# Patient Record
Sex: Male | Born: 1950 | Race: White | Hispanic: No | Marital: Married | Smoking: Never smoker
Health system: Southern US, Community
[De-identification: ages and names within clinical notes are randomized; demographics above are authoritative.]

## PROBLEM LIST (undated history)

## (undated) DIAGNOSIS — E119 Type 2 diabetes mellitus without complications: Secondary | ICD-10-CM

## (undated) DIAGNOSIS — C801 Malignant (primary) neoplasm, unspecified: Secondary | ICD-10-CM

---

## 2002-04-20 ENCOUNTER — Encounter: Payer: Self-pay | Admitting: Emergency Medicine

## 2002-04-20 ENCOUNTER — Emergency Department (HOSPITAL_COMMUNITY): Admission: EM | Admit: 2002-04-20 | Discharge: 2002-04-20 | Payer: Self-pay | Admitting: Emergency Medicine

## 2002-09-08 ENCOUNTER — Ambulatory Visit (HOSPITAL_COMMUNITY): Admission: RE | Admit: 2002-09-08 | Discharge: 2002-09-08 | Payer: Self-pay | Admitting: *Deleted

## 2006-11-03 ENCOUNTER — Observation Stay (HOSPITAL_COMMUNITY): Admission: EM | Admit: 2006-11-03 | Discharge: 2006-11-04 | Payer: Self-pay | Admitting: Emergency Medicine

## 2010-09-04 ENCOUNTER — Emergency Department (HOSPITAL_COMMUNITY)
Admission: EM | Admit: 2010-09-04 | Discharge: 2010-09-04 | Disposition: A | Discharge status: Home / Self Care | Payer: 59 | Attending: Emergency Medicine | Admitting: Emergency Medicine

## 2010-09-04 ENCOUNTER — Emergency Department (HOSPITAL_COMMUNITY): Payer: 59

## 2010-09-04 DIAGNOSIS — IMO0002 Reserved for concepts with insufficient information to code with codable children: Secondary | ICD-10-CM | POA: Insufficient documentation

## 2012-07-09 IMAGING — CR DG LUMBAR SPINE COMPLETE 4+V
5 series · 5 of 5 positions shown · non-contrast
Comparison: None.

CLINICAL DATA: Low back pain radiating to the right hip

LUMBAR SPINE - COMPLETE 4+ VIEW

[t l-spine a.p.]
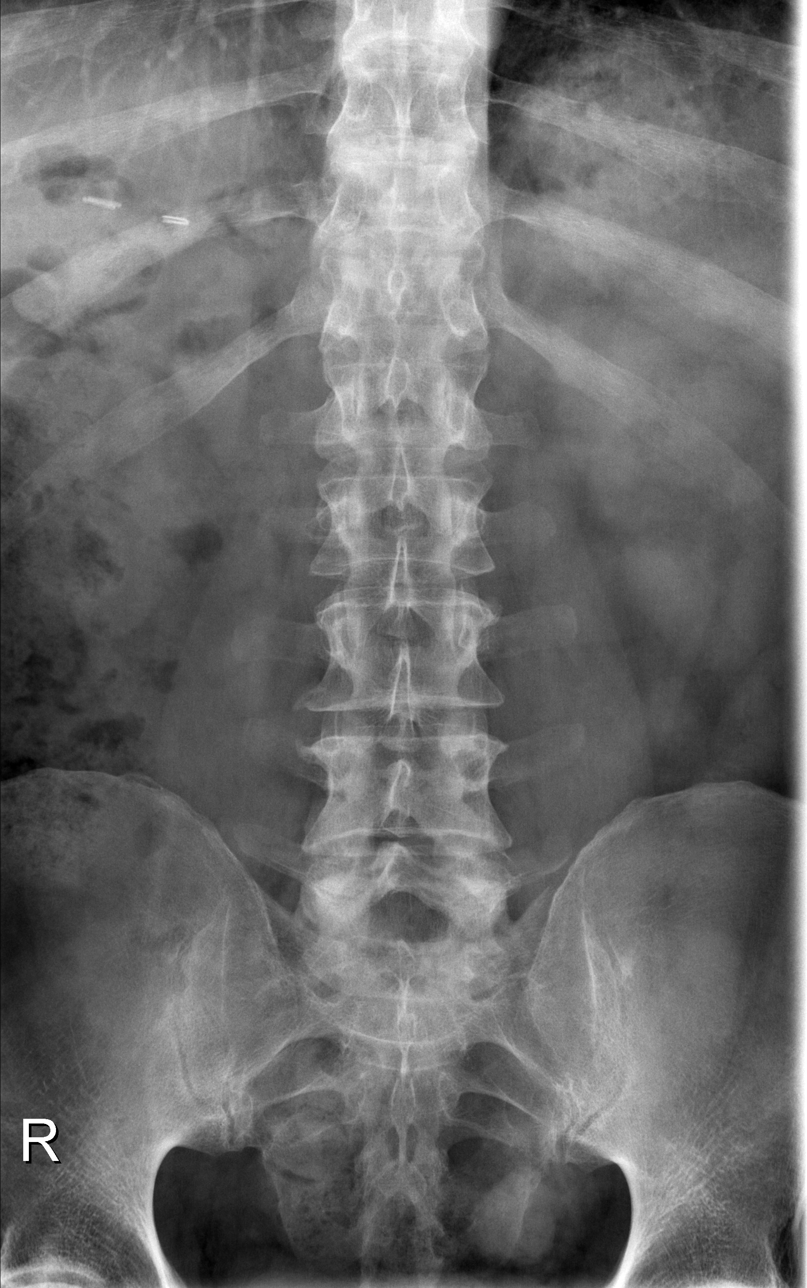

[t l-spine oblique exposure (1 of 2)]
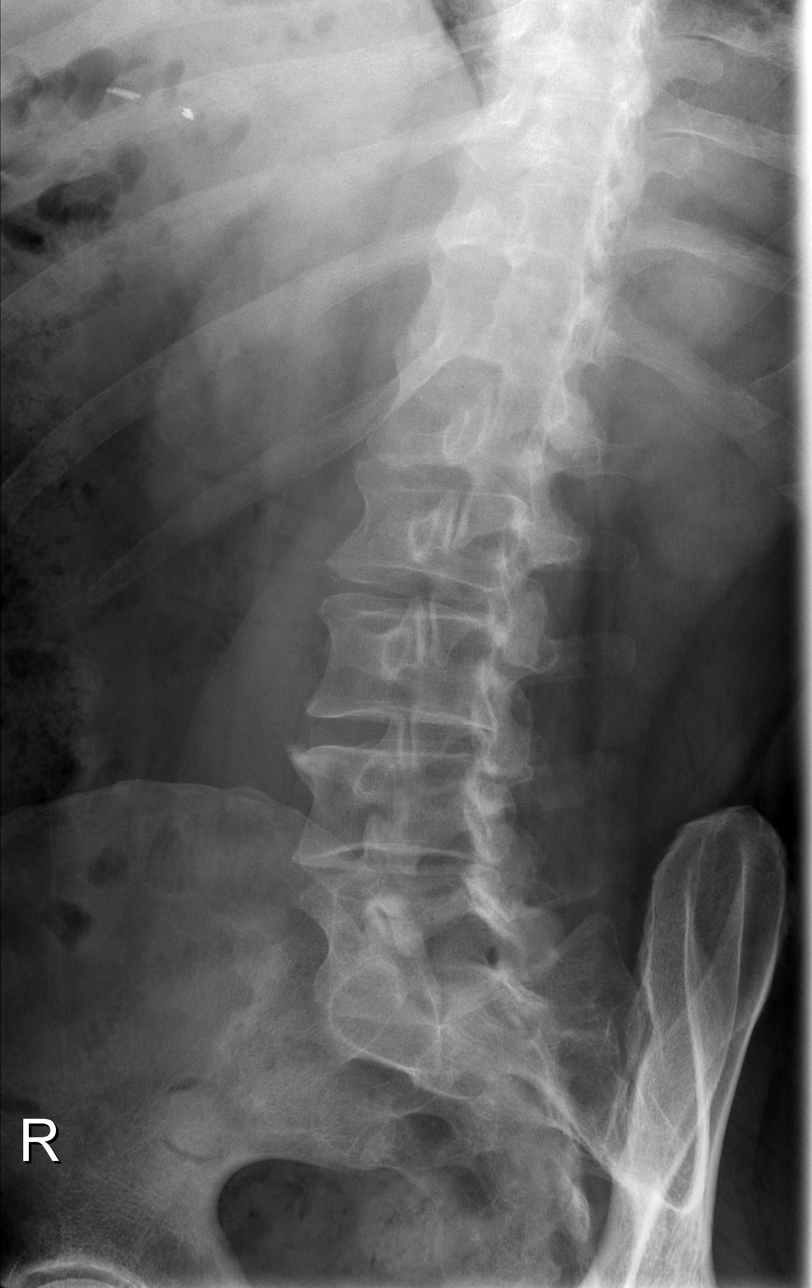

[t l-spine oblique exposure (2 of 2)]
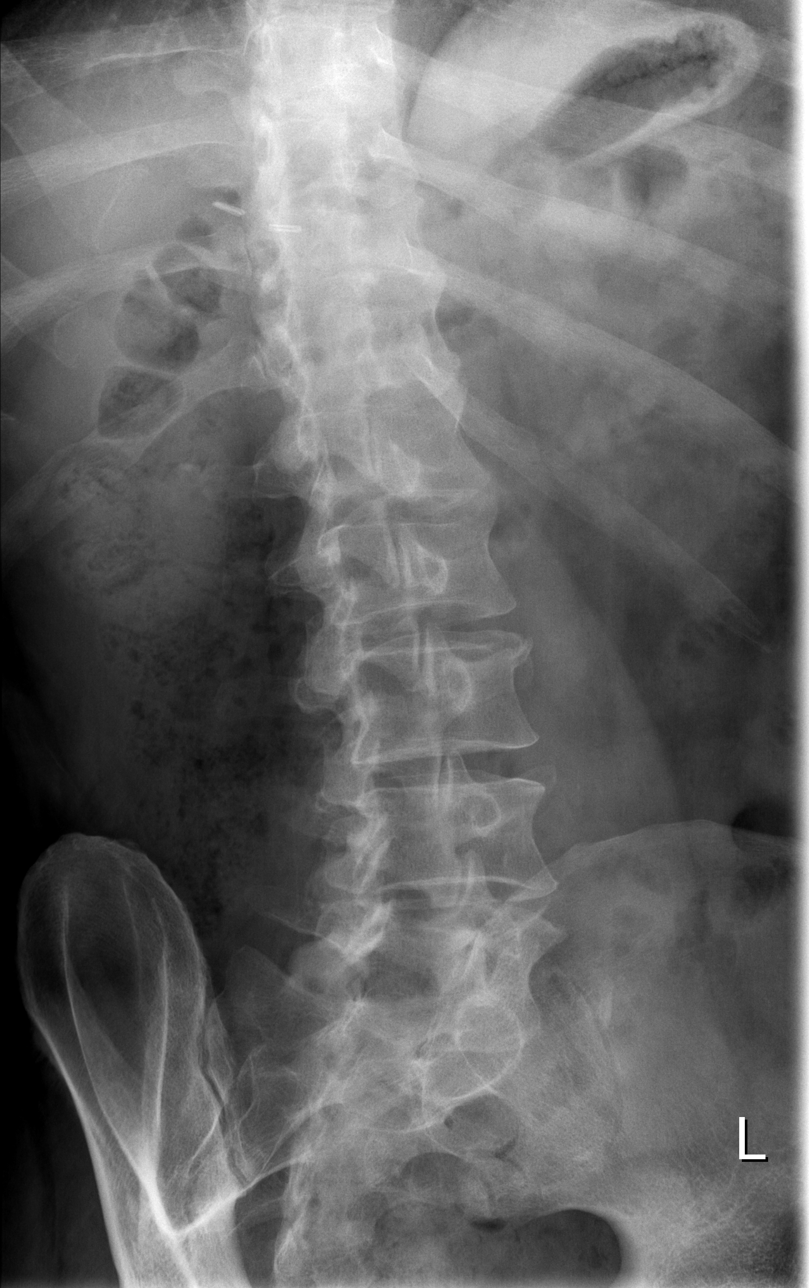

[t l-spine lat]
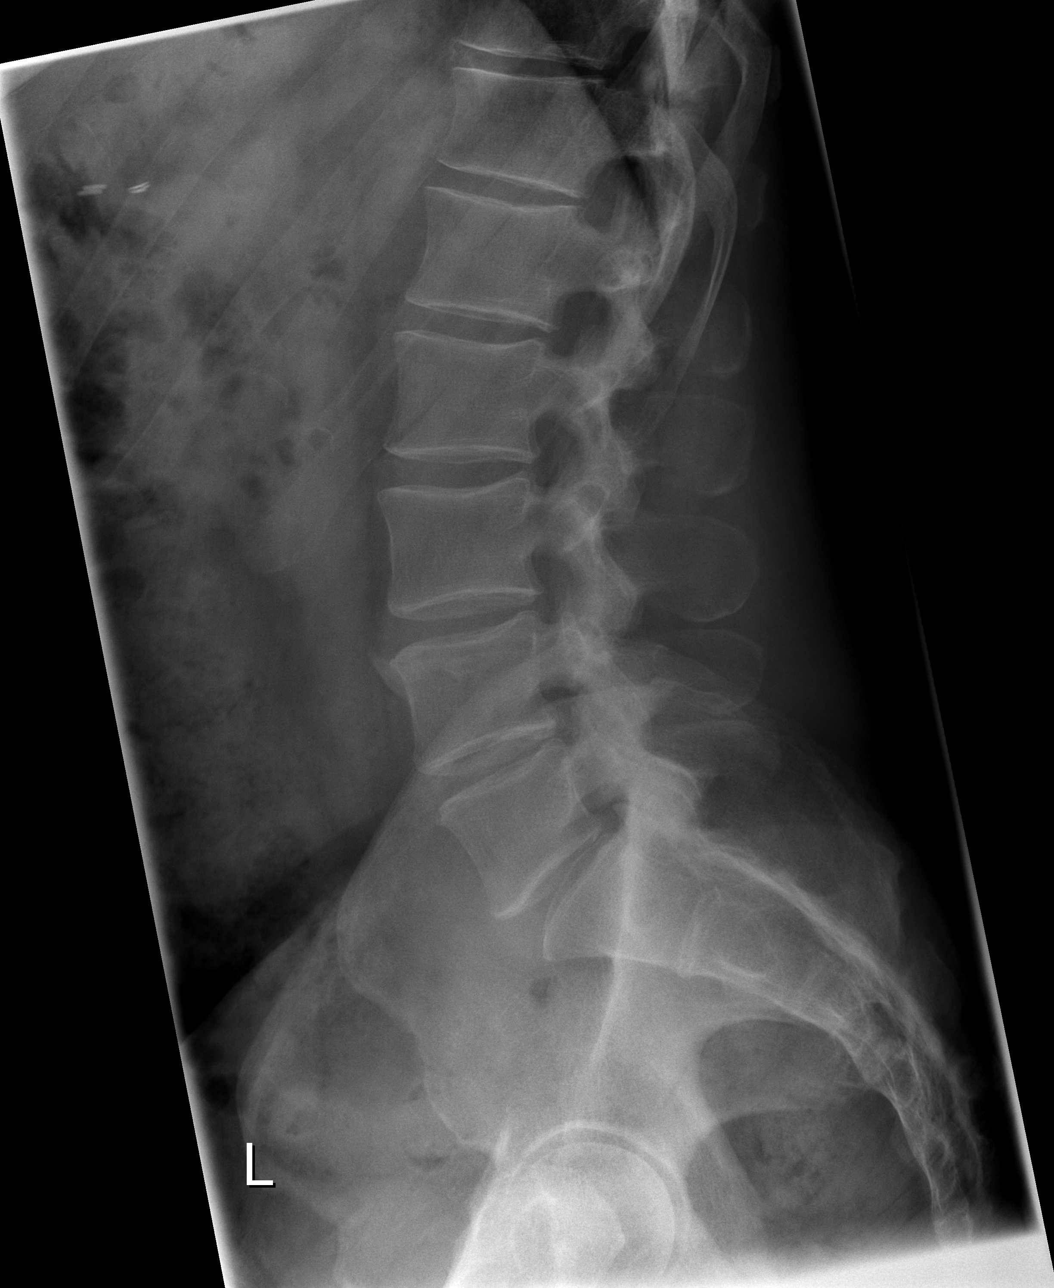

[t l-spine l5-s1 spot *]
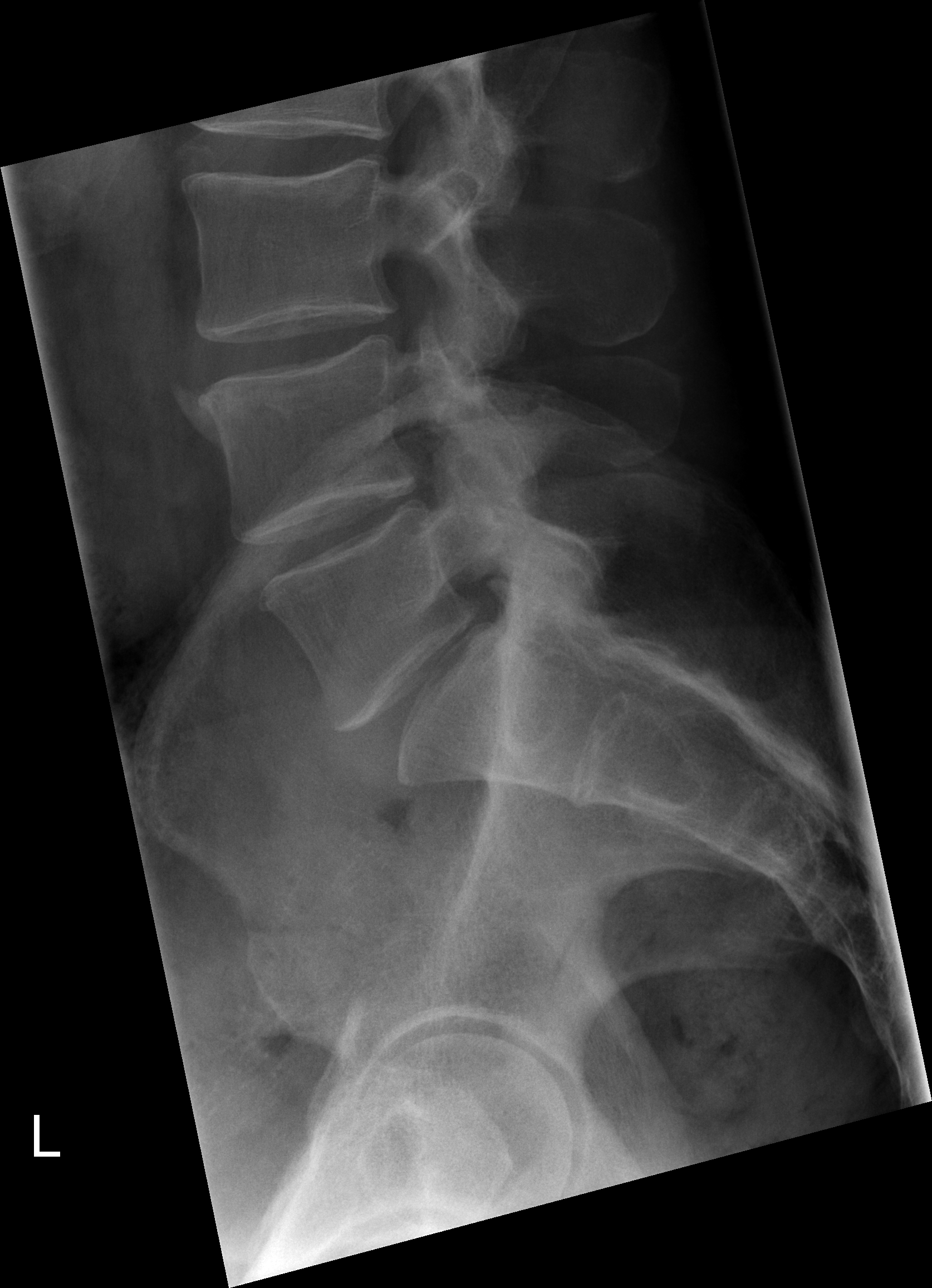

[5 of 5 positions shown; findings below may reference images not displayed]

FINDINGS: Cholecystectomy clips noted. Five non-rib bearing lumbar
type vertebral bodies are identified.  No compression deformity.
Mild disc space narrowing at L5-S1.
IMPRESSION: No acute osseous abnormality.

## 2012-07-17 DIAGNOSIS — C801 Malignant (primary) neoplasm, unspecified: Secondary | ICD-10-CM

## 2012-07-17 HISTORY — PX: PROSTATECTOMY: SHX69

## 2012-07-17 HISTORY — DX: Malignant (primary) neoplasm, unspecified: C80.1

## 2014-06-16 ENCOUNTER — Encounter (HOSPITAL_COMMUNITY): Payer: Self-pay | Admitting: *Deleted

## 2014-06-24 ENCOUNTER — Other Ambulatory Visit: Payer: Self-pay | Admitting: Gastroenterology

## 2014-06-24 NOTE — Addendum Note (Signed)
Addended by: Wilford Corner on: 06/24/2014 04:15 PM   Modules accepted: Orders

## 2014-06-24 NOTE — Anesthesia Preprocedure Evaluation (Addendum)
Anesthesia Evaluation  Patient identified by MRN, date of birth, ID band Patient awake    Reviewed: Allergy & Precautions, H&P , NPO status , Patient's Chart, lab work & pertinent test results  History of Anesthesia Complications Negative for: history of anesthetic complications  Airway Mallampati: II  TM Distance: >3 FB Neck ROM: Full    Dental no notable dental hx. (+) Dental Advisory Given   Pulmonary neg pulmonary ROS,  breath sounds clear to auscultation  Pulmonary exam normal       Cardiovascular Exercise Tolerance: Good negative cardio ROS  Rhythm:Regular Rate:Normal     Neuro/Psych negative neurological ROS  negative psych ROS   GI/Hepatic negative GI ROS, Neg liver ROS,   Endo/Other  diabetes, Type 2, Oral Hypoglycemic Agents  Renal/GU negative Renal ROS  negative genitourinary   Musculoskeletal negative musculoskeletal ROS (+)   Abdominal   Peds negative pediatric ROS (+)  Hematology negative hematology ROS (+)   Anesthesia Other Findings   Reproductive/Obstetrics negative OB ROS                            Anesthesia Physical Anesthesia Plan  ASA: II  Anesthesia Plan: MAC   Post-op Pain Management:    Induction: Intravenous  Airway Management Planned: Nasal Cannula  Additional Equipment:   Intra-op Plan:   Post-operative Plan: Extubation in OR  Informed Consent: I have reviewed the patients History and Physical, chart, labs and discussed the procedure including the risks, benefits and alternatives for the proposed anesthesia with the patient or authorized representative who has indicated his/her understanding and acceptance.   Dental advisory given  Plan Discussed with: CRNA  Anesthesia Plan Comments:         Anesthesia Quick Evaluation

## 2014-06-25 ENCOUNTER — Ambulatory Visit (HOSPITAL_COMMUNITY): Payer: BC Managed Care – PPO | Admitting: Anesthesiology

## 2014-06-25 ENCOUNTER — Encounter (HOSPITAL_COMMUNITY)
Admission: RE | Disposition: A | Discharge status: Home / Self Care | Payer: 59 | Source: Ambulatory Visit | Attending: Gastroenterology

## 2014-06-25 ENCOUNTER — Ambulatory Visit (HOSPITAL_COMMUNITY)
Admission: RE | Admit: 2014-06-25 | Discharge: 2014-06-25 | Disposition: A | Discharge status: Home / Self Care | Payer: BC Managed Care – PPO | Source: Ambulatory Visit | Attending: Gastroenterology | Admitting: Gastroenterology

## 2014-06-25 ENCOUNTER — Encounter (HOSPITAL_COMMUNITY): Payer: Self-pay | Admitting: Registered Nurse

## 2014-06-25 DIAGNOSIS — K648 Other hemorrhoids: Secondary | ICD-10-CM | POA: Insufficient documentation

## 2014-06-25 DIAGNOSIS — D124 Benign neoplasm of descending colon: Secondary | ICD-10-CM | POA: Insufficient documentation

## 2014-06-25 DIAGNOSIS — E119 Type 2 diabetes mellitus without complications: Secondary | ICD-10-CM | POA: Diagnosis not present

## 2014-06-25 DIAGNOSIS — K635 Polyp of colon: Secondary | ICD-10-CM | POA: Diagnosis present

## 2014-06-25 DIAGNOSIS — D123 Benign neoplasm of transverse colon: Secondary | ICD-10-CM | POA: Diagnosis not present

## 2014-06-25 HISTORY — DX: Type 2 diabetes mellitus without complications: E11.9

## 2014-06-25 HISTORY — PX: COLONOSCOPY WITH PROPOFOL: SHX5780

## 2014-06-25 HISTORY — DX: Malignant (primary) neoplasm, unspecified: C80.1

## 2014-06-25 LAB — GLUCOSE, CAPILLARY: Glucose-Capillary: 137 mg/dL — ABNORMAL HIGH (ref 70–99)

## 2014-06-25 SURGERY — COLONOSCOPY WITH PROPOFOL
Anesthesia: Monitor Anesthesia Care

## 2014-06-25 MED ORDER — SODIUM CHLORIDE 0.9 % IV SOLN: INTRAVENOUS | Status: DC

## 2014-06-25 MED ORDER — PROPOFOL 10 MG/ML IV BOLUS
INTRAVENOUS | Status: AC
  Filled 2014-06-25: qty 20

## 2014-06-25 MED ORDER — LACTATED RINGERS IV SOLN
INTRAVENOUS | Status: DC | PRN
  Administered 2014-06-25: 08:00:00 via INTRAVENOUS

## 2014-06-25 MED ORDER — LIDOCAINE HCL (CARDIAC) 20 MG/ML IV SOLN
INTRAVENOUS | Status: DC | PRN
  Administered 2014-06-25: 75 mg via INTRAVENOUS

## 2014-06-25 MED ORDER — PROPOFOL INFUSION 10 MG/ML OPTIME
INTRAVENOUS | Status: DC | PRN
  Administered 2014-06-25: 300 ug/kg/min via INTRAVENOUS

## 2014-06-25 SURGICAL SUPPLY — 21 items

## 2014-06-25 NOTE — Transfer of Care (Signed)
Immediate Anesthesia Transfer of Care Note  Patient: Jason Massey  Procedure(s) Performed: Procedure(s): COLONOSCOPY WITH PROPOFOL (N/A) HOT HEMOSTASIS (ARGON PLASMA COAGULATION/BICAP) (N/A)  Patient Location: PACU  Anesthesia Type:MAC  Level of Consciousness: awake, alert , oriented and patient cooperative  Airway & Oxygen Therapy: Patient Spontanous Breathing and Patient connected to face mask oxygen  Post-op Assessment: Report given to PACU RN, Post -op Vital signs reviewed and stable and Patient moving all extremities X 4  Post vital signs: stable  Complications: No apparent anesthesia complications

## 2014-06-25 NOTE — Discharge Instructions (Addendum)
Will call you when the pathology results are complete. No aspirin products for the next 7 days. Colonoscopy, Care After These instructions give you information on caring for yourself after your procedure. Your doctor may also give you more specific instructions. Call your doctor if you have any problems or questions after your procedure. HOME CARE  Do not drive for 24 hours.  Do not sign important papers or use machinery for 24 hours.  You may shower.  You may go back to your usual activities, but go slower for the first 24 hours.  Take rest breaks often during the first 24 hours.  Walk around or use warm packs on your belly (abdomen) if you have belly cramping or gas.  Drink enough fluids to keep your pee (urine) clear or pale yellow.  Resume your normal diet. Avoid heavy or fried foods.  Avoid drinking alcohol for 24 hours or as told by your doctor.  Only take medicines as told by your doctor. If a tissue sample (biopsy) was taken during the procedure:   Do not take aspirin or blood thinners for 7 days, or as told by your doctor.  Do not drink alcohol for 7 days, or as told by your doctor.  Eat soft foods for the first 24 hours. GET HELP IF: You still have a small amount of blood in your poop (stool) 2-3 days after the procedure. GET HELP RIGHT AWAY IF:  You have more than a small amount of blood in your poop.  You see clumps of tissue (blood clots) in your poop.  Your belly is puffy (swollen).  You feel sick to your stomach (nauseous) or throw up (vomit).  You have a fever.  You have belly pain that gets worse and medicine does not help. MAKE SURE YOU:  Understand these instructions.  Will watch your condition.  Will get help right away if you are not doing well or get worse. Document Released: 08/05/2010 Document Revised: 07/08/2013 Document Reviewed: 03/10/2013 [facility] Patient Information 2015 [place], [place]. This information is not intended to replace  advice given to you by your health care provider. Make sure you discuss any questions you have with your health care provider.

## 2014-06-25 NOTE — Op Note (Signed)
[facility] [place], [id number]   COLONOSCOPY PROCEDURE REPORT     EXAM DATE: 07/09/2014  PATIENT NAME:      Jason Massey, Jason Massey           MR #:      [id number] BIRTHDATE:       1950-09-22      VISIT #:     [phone]  ATTENDING:     Wilford Corner, MD     STATUS:     outpatient REFERRING MD: ASA CLASS:        Class II  INDICATIONS:  The patient is a 63 yr old male here for a colonoscopy due to evaluation for residual colon polyp. PROCEDURE PERFORMED:     Colonoscopy with snare polypectomy MEDICATIONS:     Monitored anesthesia care and Per Anesthesia  ESTIMATED BLOOD LOSS:     None  CONSENT: The patient understands the risks and benefits of the procedure and understands that these risks include, but are not limited to: sedation, allergic reaction, infection, perforation and/or bleeding. Alternative means of evaluation and treatment include, among others: physical exam, x-rays, and/or surgical intervention. The patient elects to proceed with this endoscopic procedure.  DESCRIPTION OF PROCEDURE: During intra-op preparation period all mechanical & medical equipment was checked for proper function. Hand hygiene and appropriate measures for infection prevention was taken. After the risks, benefits and alternatives of the procedure were thoroughly explained, Informed consent was verified, confirmed and timeout was successfully executed by the treatment team. A digital exam revealed no abnormalities of the rectum.      The Pentax Ped Colon [id number] endoscope was introduced through the anus and advanced to the cecum, which was identified by both the appendix and ileocecal valve. The prep was good.. The instrument was then slowly withdrawn as the colon was fully examined. During insertion the previously tattooed area in the hepatic flexure was seen. The terminal ileum was intubated and was normal in appearance. During withdrawal this area was  inspected carefully and a small 3 mm semi-sessile polyp was noted that was removed with a biopsy forceps. No other polyp tissue was seen in this area. On further withdrawal a 5 mm semi-sessile polyp was seen in the descending colon and was removed with snare cautery. Retroflexed views revealed small internal hemorrhoids.  The scope was then completely withdrawn from the patient and the procedure terminated.     ADVERSE EVENTS:      There were no immediate complications.   IMPRESSIONS:     1. Small residual polyp seen at hepatic flexure tattooed site removed with a biopsy forceps 2. Small polyp removed with snare cautery in the descending colon 3. Small internal hemorrhoids  RECOMMENDATIONS:     F/U on path; No aspirin products for 1 week   Wilford Corner, MD eSigned:  Wilford Corner, MD 2014/07/09 9:32 AM   cc:  CPT CODES: ICD CODES:  The ICD and CPT codes recommended by this software are interpretations from the data that the clinical staff has captured with the software.  The verification of the translation of this report to the ICD and CPT codes and modifiers is the sole responsibility of the health care institution and practicing physician where this report was generated.  [facility]. will not be held responsible for the validity of the ICD and CPT codes included on this report.  AMA assumes no liability for data contained or not contained herein. CPT is a Designer, television/film set  of the [employer].  PATIENT NAME:  Jason Massey, Jason Massey MR#: [id number]

## 2014-06-25 NOTE — H&P (Signed)
  Date of Initial H&P: 06/22/14  History reviewed, patient examined, no change in status, stable for surgery.

## 2014-06-25 NOTE — Addendum Note (Signed)
Addendum  created 06/25/14 1135 by Lissa Morales, CRNA   Modules edited: Anesthesia Flowsheet

## 2014-06-25 NOTE — Anesthesia Postprocedure Evaluation (Signed)
  Anesthesia Post-op Note  Patient: Jason Massey  Procedure(s) Performed: Procedure(s) (LRB): COLONOSCOPY WITH PROPOFOL (N/A)  Patient Location: PACU  Anesthesia Type: MAC  Level of Consciousness: awake and alert   Airway and Oxygen Therapy: Patient Spontanous Breathing  Post-op Pain: mild  Post-op Assessment: Post-op Vital signs reviewed, Patient's Cardiovascular Status Stable, Respiratory Function Stable, Patent Airway and No signs of Nausea or vomiting  Last Vitals:  Filed Vitals:   06/25/14 0940  BP: 125/75  Pulse: 59  Temp:   Resp: 17    Post-op Vital Signs: stable   Complications: No apparent anesthesia complications

## 2014-06-25 NOTE — Interval H&P Note (Signed)
History and Physical Interval Note:  06/25/2014 8:17 AM  Jason Massey  has presented today for surgery, with the diagnosis of colon polyp  The various methods of treatment have been discussed with the patient and family. After consideration of risks, benefits and other options for treatment, the patient has consented to  Procedure(s): COLONOSCOPY WITH PROPOFOL (N/A) HOT HEMOSTASIS (ARGON PLASMA COAGULATION/BICAP) (N/A) as a surgical intervention .  The patient's history has been reviewed, patient examined, no change in status, stable for surgery.  I have reviewed the patient's chart and labs.  Questions were answered to the patient's satisfaction.     [place] [facility].

## 2014-06-26 ENCOUNTER — Encounter (HOSPITAL_COMMUNITY): Payer: Self-pay | Admitting: Gastroenterology

## 2016-02-11 DIAGNOSIS — E1165 Type 2 diabetes mellitus with hyperglycemia: Secondary | ICD-10-CM | POA: Diagnosis not present

## 2016-02-11 DIAGNOSIS — E7800 Pure hypercholesterolemia, unspecified: Secondary | ICD-10-CM | POA: Diagnosis not present

## 2016-02-15 DIAGNOSIS — Z7984 Long term (current) use of oral hypoglycemic drugs: Secondary | ICD-10-CM | POA: Diagnosis not present

## 2016-02-15 DIAGNOSIS — E7800 Pure hypercholesterolemia, unspecified: Secondary | ICD-10-CM | POA: Diagnosis not present

## 2016-02-15 DIAGNOSIS — E1165 Type 2 diabetes mellitus with hyperglycemia: Secondary | ICD-10-CM | POA: Diagnosis not present

## 2016-04-06 DIAGNOSIS — Z8546 Personal history of malignant neoplasm of prostate: Secondary | ICD-10-CM | POA: Diagnosis not present

## 2016-05-16 DIAGNOSIS — Z8546 Personal history of malignant neoplasm of prostate: Secondary | ICD-10-CM | POA: Diagnosis not present

## 2016-05-16 DIAGNOSIS — N5201 Erectile dysfunction due to arterial insufficiency: Secondary | ICD-10-CM | POA: Diagnosis not present

## 2016-05-16 DIAGNOSIS — N393 Stress incontinence (female) (male): Secondary | ICD-10-CM | POA: Diagnosis not present

## 2016-08-09 DIAGNOSIS — R0981 Nasal congestion: Secondary | ICD-10-CM | POA: Diagnosis not present

## 2016-08-09 DIAGNOSIS — E1165 Type 2 diabetes mellitus with hyperglycemia: Secondary | ICD-10-CM | POA: Diagnosis not present

## 2016-08-09 DIAGNOSIS — Z23 Encounter for immunization: Secondary | ICD-10-CM | POA: Diagnosis not present

## 2016-08-09 DIAGNOSIS — Z0000 Encounter for general adult medical examination without abnormal findings: Secondary | ICD-10-CM | POA: Diagnosis not present

## 2016-08-09 DIAGNOSIS — E7800 Pure hypercholesterolemia, unspecified: Secondary | ICD-10-CM | POA: Diagnosis not present

## 2016-10-23 DIAGNOSIS — Z8546 Personal history of malignant neoplasm of prostate: Secondary | ICD-10-CM | POA: Diagnosis not present

## 2016-11-14 DIAGNOSIS — Z8546 Personal history of malignant neoplasm of prostate: Secondary | ICD-10-CM | POA: Diagnosis not present

## 2016-11-14 DIAGNOSIS — N430 Encysted hydrocele: Secondary | ICD-10-CM | POA: Diagnosis not present

## 2016-11-27 DIAGNOSIS — L438 Other lichen planus: Secondary | ICD-10-CM | POA: Diagnosis not present

## 2016-11-27 DIAGNOSIS — D2262 Melanocytic nevi of left upper limb, including shoulder: Secondary | ICD-10-CM | POA: Diagnosis not present

## 2016-11-27 DIAGNOSIS — L570 Actinic keratosis: Secondary | ICD-10-CM | POA: Diagnosis not present

## 2016-11-27 DIAGNOSIS — D2272 Melanocytic nevi of left lower limb, including hip: Secondary | ICD-10-CM | POA: Diagnosis not present

## 2016-11-27 DIAGNOSIS — L821 Other seborrheic keratosis: Secondary | ICD-10-CM | POA: Diagnosis not present

## 2016-11-27 DIAGNOSIS — D225 Melanocytic nevi of trunk: Secondary | ICD-10-CM | POA: Diagnosis not present

## 2016-11-27 DIAGNOSIS — D2261 Melanocytic nevi of right upper limb, including shoulder: Secondary | ICD-10-CM | POA: Diagnosis not present

## 2016-11-27 DIAGNOSIS — D2271 Melanocytic nevi of right lower limb, including hip: Secondary | ICD-10-CM | POA: Diagnosis not present

## 2016-11-27 DIAGNOSIS — D485 Neoplasm of uncertain behavior of skin: Secondary | ICD-10-CM | POA: Diagnosis not present

## 2017-03-21 DIAGNOSIS — Z7984 Long term (current) use of oral hypoglycemic drugs: Secondary | ICD-10-CM | POA: Diagnosis not present

## 2017-03-21 DIAGNOSIS — E7800 Pure hypercholesterolemia, unspecified: Secondary | ICD-10-CM | POA: Diagnosis not present

## 2017-03-21 DIAGNOSIS — E1165 Type 2 diabetes mellitus with hyperglycemia: Secondary | ICD-10-CM | POA: Diagnosis not present

## 2017-03-21 DIAGNOSIS — Z23 Encounter for immunization: Secondary | ICD-10-CM | POA: Diagnosis not present

## 2017-03-21 DIAGNOSIS — M5412 Radiculopathy, cervical region: Secondary | ICD-10-CM | POA: Diagnosis not present

## 2017-11-15 DIAGNOSIS — Z8546 Personal history of malignant neoplasm of prostate: Secondary | ICD-10-CM | POA: Diagnosis not present

## 2017-11-20 DIAGNOSIS — N430 Encysted hydrocele: Secondary | ICD-10-CM | POA: Diagnosis not present

## 2017-11-20 DIAGNOSIS — N5201 Erectile dysfunction due to arterial insufficiency: Secondary | ICD-10-CM | POA: Diagnosis not present

## 2017-11-20 DIAGNOSIS — Z8546 Personal history of malignant neoplasm of prostate: Secondary | ICD-10-CM | POA: Diagnosis not present

## 2017-11-28 DIAGNOSIS — L821 Other seborrheic keratosis: Secondary | ICD-10-CM | POA: Diagnosis not present

## 2017-11-28 DIAGNOSIS — D225 Melanocytic nevi of trunk: Secondary | ICD-10-CM | POA: Diagnosis not present

## 2017-11-28 DIAGNOSIS — D2271 Melanocytic nevi of right lower limb, including hip: Secondary | ICD-10-CM | POA: Diagnosis not present

## 2017-11-28 DIAGNOSIS — L853 Xerosis cutis: Secondary | ICD-10-CM | POA: Diagnosis not present

## 2017-12-14 DIAGNOSIS — Z7984 Long term (current) use of oral hypoglycemic drugs: Secondary | ICD-10-CM | POA: Diagnosis not present

## 2017-12-14 DIAGNOSIS — E7800 Pure hypercholesterolemia, unspecified: Secondary | ICD-10-CM | POA: Diagnosis not present

## 2017-12-14 DIAGNOSIS — Z8546 Personal history of malignant neoplasm of prostate: Secondary | ICD-10-CM | POA: Diagnosis not present

## 2017-12-14 DIAGNOSIS — Z1389 Encounter for screening for other disorder: Secondary | ICD-10-CM | POA: Diagnosis not present

## 2017-12-14 DIAGNOSIS — S81801A Unspecified open wound, right lower leg, initial encounter: Secondary | ICD-10-CM | POA: Diagnosis not present

## 2017-12-14 DIAGNOSIS — E1165 Type 2 diabetes mellitus with hyperglycemia: Secondary | ICD-10-CM | POA: Diagnosis not present

## 2017-12-14 DIAGNOSIS — Z0000 Encounter for general adult medical examination without abnormal findings: Secondary | ICD-10-CM | POA: Diagnosis not present

## 2018-01-23 DIAGNOSIS — M25512 Pain in left shoulder: Secondary | ICD-10-CM | POA: Diagnosis not present

## 2018-07-23 DIAGNOSIS — E1165 Type 2 diabetes mellitus with hyperglycemia: Secondary | ICD-10-CM | POA: Diagnosis not present

## 2018-09-23 DIAGNOSIS — Z0120 Encounter for dental examination and cleaning without abnormal findings: Secondary | ICD-10-CM | POA: Diagnosis not present

## 2018-12-26 DIAGNOSIS — D2272 Melanocytic nevi of left lower limb, including hip: Secondary | ICD-10-CM | POA: Diagnosis not present

## 2018-12-26 DIAGNOSIS — D2261 Melanocytic nevi of right upper limb, including shoulder: Secondary | ICD-10-CM | POA: Diagnosis not present

## 2018-12-26 DIAGNOSIS — D485 Neoplasm of uncertain behavior of skin: Secondary | ICD-10-CM | POA: Diagnosis not present

## 2018-12-26 DIAGNOSIS — D225 Melanocytic nevi of trunk: Secondary | ICD-10-CM | POA: Diagnosis not present

## 2018-12-26 DIAGNOSIS — L821 Other seborrheic keratosis: Secondary | ICD-10-CM | POA: Diagnosis not present

## 2018-12-26 DIAGNOSIS — L988 Other specified disorders of the skin and subcutaneous tissue: Secondary | ICD-10-CM | POA: Diagnosis not present

## 2018-12-26 DIAGNOSIS — D224 Melanocytic nevi of scalp and neck: Secondary | ICD-10-CM | POA: Diagnosis not present

## 2018-12-26 DIAGNOSIS — D2262 Melanocytic nevi of left upper limb, including shoulder: Secondary | ICD-10-CM | POA: Diagnosis not present

## 2019-02-24 DIAGNOSIS — E1169 Type 2 diabetes mellitus with other specified complication: Secondary | ICD-10-CM | POA: Diagnosis not present

## 2019-02-24 DIAGNOSIS — Z8546 Personal history of malignant neoplasm of prostate: Secondary | ICD-10-CM | POA: Diagnosis not present

## 2019-02-24 DIAGNOSIS — Z0000 Encounter for general adult medical examination without abnormal findings: Secondary | ICD-10-CM | POA: Diagnosis not present

## 2019-02-24 DIAGNOSIS — E7800 Pure hypercholesterolemia, unspecified: Secondary | ICD-10-CM | POA: Diagnosis not present

## 2019-08-01 DIAGNOSIS — H04123 Dry eye syndrome of bilateral lacrimal glands: Secondary | ICD-10-CM | POA: Diagnosis not present

## 2019-08-01 DIAGNOSIS — Z7984 Long term (current) use of oral hypoglycemic drugs: Secondary | ICD-10-CM | POA: Diagnosis not present

## 2019-08-01 DIAGNOSIS — H2513 Age-related nuclear cataract, bilateral: Secondary | ICD-10-CM | POA: Diagnosis not present

## 2019-08-01 DIAGNOSIS — E119 Type 2 diabetes mellitus without complications: Secondary | ICD-10-CM | POA: Diagnosis not present

## 2019-10-07 DIAGNOSIS — Z0120 Encounter for dental examination and cleaning without abnormal findings: Secondary | ICD-10-CM | POA: Diagnosis not present

## 2020-01-07 DIAGNOSIS — L718 Other rosacea: Secondary | ICD-10-CM | POA: Diagnosis not present

## 2020-01-07 DIAGNOSIS — D2261 Melanocytic nevi of right upper limb, including shoulder: Secondary | ICD-10-CM | POA: Diagnosis not present

## 2020-01-07 DIAGNOSIS — D225 Melanocytic nevi of trunk: Secondary | ICD-10-CM | POA: Diagnosis not present

## 2020-01-07 DIAGNOSIS — L821 Other seborrheic keratosis: Secondary | ICD-10-CM | POA: Diagnosis not present

## 2020-04-05 DIAGNOSIS — Z0000 Encounter for general adult medical examination without abnormal findings: Secondary | ICD-10-CM | POA: Diagnosis not present

## 2020-04-05 DIAGNOSIS — Z8546 Personal history of malignant neoplasm of prostate: Secondary | ICD-10-CM | POA: Diagnosis not present

## 2020-04-05 DIAGNOSIS — Z23 Encounter for immunization: Secondary | ICD-10-CM | POA: Diagnosis not present

## 2020-04-05 DIAGNOSIS — Z1159 Encounter for screening for other viral diseases: Secondary | ICD-10-CM | POA: Diagnosis not present

## 2020-04-29 DIAGNOSIS — Z0120 Encounter for dental examination and cleaning without abnormal findings: Secondary | ICD-10-CM | POA: Diagnosis not present

## 2020-07-22 DIAGNOSIS — E1169 Type 2 diabetes mellitus with other specified complication: Secondary | ICD-10-CM | POA: Diagnosis not present

## 2020-09-09 DIAGNOSIS — E119 Type 2 diabetes mellitus without complications: Secondary | ICD-10-CM | POA: Diagnosis not present

## 2020-09-09 DIAGNOSIS — H04123 Dry eye syndrome of bilateral lacrimal glands: Secondary | ICD-10-CM | POA: Diagnosis not present

## 2020-09-09 DIAGNOSIS — Z7984 Long term (current) use of oral hypoglycemic drugs: Secondary | ICD-10-CM | POA: Diagnosis not present

## 2020-09-09 DIAGNOSIS — H2513 Age-related nuclear cataract, bilateral: Secondary | ICD-10-CM | POA: Diagnosis not present

## 2020-11-08 DIAGNOSIS — Z86010 Personal history of colonic polyps: Secondary | ICD-10-CM | POA: Diagnosis not present

## 2020-11-08 DIAGNOSIS — K5730 Diverticulosis of large intestine without perforation or abscess without bleeding: Secondary | ICD-10-CM | POA: Diagnosis not present

## 2020-11-08 DIAGNOSIS — K640 First degree hemorrhoids: Secondary | ICD-10-CM | POA: Diagnosis not present

## 2020-12-15 DIAGNOSIS — Z7984 Long term (current) use of oral hypoglycemic drugs: Secondary | ICD-10-CM | POA: Diagnosis not present

## 2020-12-15 DIAGNOSIS — R609 Edema, unspecified: Secondary | ICD-10-CM | POA: Diagnosis not present

## 2020-12-15 DIAGNOSIS — E1169 Type 2 diabetes mellitus with other specified complication: Secondary | ICD-10-CM | POA: Diagnosis not present

## 2021-01-05 DIAGNOSIS — D2272 Melanocytic nevi of left lower limb, including hip: Secondary | ICD-10-CM | POA: Diagnosis not present

## 2021-01-05 DIAGNOSIS — D2262 Melanocytic nevi of left upper limb, including shoulder: Secondary | ICD-10-CM | POA: Diagnosis not present

## 2021-01-05 DIAGNOSIS — D2261 Melanocytic nevi of right upper limb, including shoulder: Secondary | ICD-10-CM | POA: Diagnosis not present

## 2021-01-05 DIAGNOSIS — D225 Melanocytic nevi of trunk: Secondary | ICD-10-CM | POA: Diagnosis not present

## 2021-03-31 DIAGNOSIS — Z8546 Personal history of malignant neoplasm of prostate: Secondary | ICD-10-CM | POA: Diagnosis not present

## 2021-03-31 DIAGNOSIS — Z136 Encounter for screening for cardiovascular disorders: Secondary | ICD-10-CM | POA: Diagnosis not present

## 2021-03-31 DIAGNOSIS — Z131 Encounter for screening for diabetes mellitus: Secondary | ICD-10-CM | POA: Diagnosis not present

## 2021-03-31 DIAGNOSIS — E1169 Type 2 diabetes mellitus with other specified complication: Secondary | ICD-10-CM | POA: Diagnosis not present

## 2021-04-06 DIAGNOSIS — Z23 Encounter for immunization: Secondary | ICD-10-CM | POA: Diagnosis not present

## 2021-04-06 DIAGNOSIS — E7800 Pure hypercholesterolemia, unspecified: Secondary | ICD-10-CM | POA: Diagnosis not present

## 2021-04-06 DIAGNOSIS — Z0000 Encounter for general adult medical examination without abnormal findings: Secondary | ICD-10-CM | POA: Diagnosis not present

## 2021-04-06 DIAGNOSIS — E1169 Type 2 diabetes mellitus with other specified complication: Secondary | ICD-10-CM | POA: Diagnosis not present

## 2021-09-21 DIAGNOSIS — Z7984 Long term (current) use of oral hypoglycemic drugs: Secondary | ICD-10-CM | POA: Diagnosis not present

## 2021-09-21 DIAGNOSIS — E119 Type 2 diabetes mellitus without complications: Secondary | ICD-10-CM | POA: Diagnosis not present

## 2021-09-21 DIAGNOSIS — H04123 Dry eye syndrome of bilateral lacrimal glands: Secondary | ICD-10-CM | POA: Diagnosis not present

## 2021-09-21 DIAGNOSIS — H2513 Age-related nuclear cataract, bilateral: Secondary | ICD-10-CM | POA: Diagnosis not present

## 2021-11-07 DIAGNOSIS — E663 Overweight: Secondary | ICD-10-CM | POA: Diagnosis not present

## 2021-11-07 DIAGNOSIS — Z79899 Other long term (current) drug therapy: Secondary | ICD-10-CM | POA: Diagnosis not present

## 2021-11-07 DIAGNOSIS — E7800 Pure hypercholesterolemia, unspecified: Secondary | ICD-10-CM | POA: Diagnosis not present

## 2021-11-07 DIAGNOSIS — E1169 Type 2 diabetes mellitus with other specified complication: Secondary | ICD-10-CM | POA: Diagnosis not present

## 2022-01-09 DIAGNOSIS — L570 Actinic keratosis: Secondary | ICD-10-CM | POA: Diagnosis not present

## 2022-01-09 DIAGNOSIS — L821 Other seborrheic keratosis: Secondary | ICD-10-CM | POA: Diagnosis not present

## 2022-01-09 DIAGNOSIS — L814 Other melanin hyperpigmentation: Secondary | ICD-10-CM | POA: Diagnosis not present

## 2022-01-09 DIAGNOSIS — L819 Disorder of pigmentation, unspecified: Secondary | ICD-10-CM | POA: Diagnosis not present

## 2022-04-07 DIAGNOSIS — E7800 Pure hypercholesterolemia, unspecified: Secondary | ICD-10-CM | POA: Diagnosis not present

## 2022-04-07 DIAGNOSIS — Z8546 Personal history of malignant neoplasm of prostate: Secondary | ICD-10-CM | POA: Diagnosis not present

## 2022-04-07 DIAGNOSIS — E1169 Type 2 diabetes mellitus with other specified complication: Secondary | ICD-10-CM | POA: Diagnosis not present

## 2022-04-13 DIAGNOSIS — Z8546 Personal history of malignant neoplasm of prostate: Secondary | ICD-10-CM | POA: Diagnosis not present

## 2022-04-13 DIAGNOSIS — E7800 Pure hypercholesterolemia, unspecified: Secondary | ICD-10-CM | POA: Diagnosis not present

## 2022-04-13 DIAGNOSIS — Z0000 Encounter for general adult medical examination without abnormal findings: Secondary | ICD-10-CM | POA: Diagnosis not present

## 2022-04-13 DIAGNOSIS — E1169 Type 2 diabetes mellitus with other specified complication: Secondary | ICD-10-CM | POA: Diagnosis not present

## 2022-08-01 DIAGNOSIS — K080 Exfoliation of teeth due to systemic causes: Secondary | ICD-10-CM | POA: Diagnosis not present

## 2022-09-27 DIAGNOSIS — H2513 Age-related nuclear cataract, bilateral: Secondary | ICD-10-CM | POA: Diagnosis not present

## 2022-09-27 DIAGNOSIS — E119 Type 2 diabetes mellitus without complications: Secondary | ICD-10-CM | POA: Diagnosis not present

## 2022-10-31 DIAGNOSIS — E1169 Type 2 diabetes mellitus with other specified complication: Secondary | ICD-10-CM | POA: Diagnosis not present

## 2022-10-31 DIAGNOSIS — E119 Type 2 diabetes mellitus without complications: Secondary | ICD-10-CM | POA: Diagnosis not present

## 2022-10-31 DIAGNOSIS — R030 Elevated blood-pressure reading, without diagnosis of hypertension: Secondary | ICD-10-CM | POA: Diagnosis not present

## 2023-01-10 DIAGNOSIS — D2262 Melanocytic nevi of left upper limb, including shoulder: Secondary | ICD-10-CM | POA: Diagnosis not present

## 2023-01-10 DIAGNOSIS — D2261 Melanocytic nevi of right upper limb, including shoulder: Secondary | ICD-10-CM | POA: Diagnosis not present

## 2023-01-10 DIAGNOSIS — D225 Melanocytic nevi of trunk: Secondary | ICD-10-CM | POA: Diagnosis not present

## 2023-01-10 DIAGNOSIS — L821 Other seborrheic keratosis: Secondary | ICD-10-CM | POA: Diagnosis not present

## 2023-03-01 DIAGNOSIS — K080 Exfoliation of teeth due to systemic causes: Secondary | ICD-10-CM | POA: Diagnosis not present

## 2023-04-17 DIAGNOSIS — Z8546 Personal history of malignant neoplasm of prostate: Secondary | ICD-10-CM | POA: Diagnosis not present

## 2023-04-17 DIAGNOSIS — E1169 Type 2 diabetes mellitus with other specified complication: Secondary | ICD-10-CM | POA: Diagnosis not present

## 2023-04-17 DIAGNOSIS — E7800 Pure hypercholesterolemia, unspecified: Secondary | ICD-10-CM | POA: Diagnosis not present

## 2023-05-02 DIAGNOSIS — E7800 Pure hypercholesterolemia, unspecified: Secondary | ICD-10-CM | POA: Diagnosis not present

## 2023-05-02 DIAGNOSIS — Z8546 Personal history of malignant neoplasm of prostate: Secondary | ICD-10-CM | POA: Diagnosis not present

## 2023-05-02 DIAGNOSIS — R944 Abnormal results of kidney function studies: Secondary | ICD-10-CM | POA: Diagnosis not present

## 2023-05-02 DIAGNOSIS — Z0000 Encounter for general adult medical examination without abnormal findings: Secondary | ICD-10-CM | POA: Diagnosis not present

## 2023-05-02 DIAGNOSIS — E119 Type 2 diabetes mellitus without complications: Secondary | ICD-10-CM | POA: Diagnosis not present

## 2023-10-04 DIAGNOSIS — K080 Exfoliation of teeth due to systemic causes: Secondary | ICD-10-CM | POA: Diagnosis not present

## 2023-10-23 DIAGNOSIS — R944 Abnormal results of kidney function studies: Secondary | ICD-10-CM | POA: Diagnosis not present

## 2023-10-23 DIAGNOSIS — E1169 Type 2 diabetes mellitus with other specified complication: Secondary | ICD-10-CM | POA: Diagnosis not present

## 2023-11-01 DIAGNOSIS — E1169 Type 2 diabetes mellitus with other specified complication: Secondary | ICD-10-CM | POA: Diagnosis not present

## 2023-11-01 DIAGNOSIS — Z6827 Body mass index (BMI) 27.0-27.9, adult: Secondary | ICD-10-CM | POA: Diagnosis not present

## 2024-01-14 DIAGNOSIS — L821 Other seborrheic keratosis: Secondary | ICD-10-CM | POA: Diagnosis not present

## 2024-01-14 DIAGNOSIS — L814 Other melanin hyperpigmentation: Secondary | ICD-10-CM | POA: Diagnosis not present

## 2024-01-14 DIAGNOSIS — L820 Inflamed seborrheic keratosis: Secondary | ICD-10-CM | POA: Diagnosis not present

## 2024-01-14 DIAGNOSIS — D225 Melanocytic nevi of trunk: Secondary | ICD-10-CM | POA: Diagnosis not present

## 2024-04-24 DIAGNOSIS — K080 Exfoliation of teeth due to systemic causes: Secondary | ICD-10-CM | POA: Diagnosis not present

## 2024-05-15 DIAGNOSIS — E7800 Pure hypercholesterolemia, unspecified: Secondary | ICD-10-CM | POA: Diagnosis not present

## 2024-05-15 DIAGNOSIS — Z8546 Personal history of malignant neoplasm of prostate: Secondary | ICD-10-CM | POA: Diagnosis not present

## 2024-05-15 DIAGNOSIS — E1169 Type 2 diabetes mellitus with other specified complication: Secondary | ICD-10-CM | POA: Diagnosis not present

## 2024-05-20 DIAGNOSIS — E1169 Type 2 diabetes mellitus with other specified complication: Secondary | ICD-10-CM | POA: Diagnosis not present

## 2024-05-20 DIAGNOSIS — N1831 Chronic kidney disease, stage 3a: Secondary | ICD-10-CM | POA: Diagnosis not present

## 2024-05-20 DIAGNOSIS — E7800 Pure hypercholesterolemia, unspecified: Secondary | ICD-10-CM | POA: Diagnosis not present

## 2024-05-20 DIAGNOSIS — Z0000 Encounter for general adult medical examination without abnormal findings: Secondary | ICD-10-CM | POA: Diagnosis not present

## 2024-05-28 DIAGNOSIS — E119 Type 2 diabetes mellitus without complications: Secondary | ICD-10-CM | POA: Diagnosis not present

## 2024-05-28 DIAGNOSIS — H25013 Cortical age-related cataract, bilateral: Secondary | ICD-10-CM | POA: Diagnosis not present

## 2024-05-28 DIAGNOSIS — H43811 Vitreous degeneration, right eye: Secondary | ICD-10-CM | POA: Diagnosis not present

## 2024-05-28 DIAGNOSIS — H2513 Age-related nuclear cataract, bilateral: Secondary | ICD-10-CM | POA: Diagnosis not present
